# Patient Record
Sex: Female | Born: 1960 | Race: White | Hispanic: No | Smoking: Never smoker
Health system: Southern US, Community
[De-identification: ages and names within clinical notes are randomized; demographics above are authoritative.]

---

## 2004-07-26 ENCOUNTER — Emergency Department (HOSPITAL_COMMUNITY): Admission: EM | Admit: 2004-07-26 | Discharge: 2004-07-26 | Payer: Self-pay

## 2004-11-16 ENCOUNTER — Emergency Department (HOSPITAL_COMMUNITY): Admission: EM | Admit: 2004-11-16 | Discharge: 2004-11-16 | Payer: Self-pay | Admitting: Family Medicine

## 2007-01-13 ENCOUNTER — Other Ambulatory Visit: Admission: RE | Admit: 2007-01-13 | Discharge: 2007-01-13 | Payer: Self-pay | Admitting: Gynecology

## 2007-04-12 ENCOUNTER — Ambulatory Visit (HOSPITAL_BASED_OUTPATIENT_CLINIC_OR_DEPARTMENT_OTHER): Admission: RE | Admit: 2007-04-12 | Discharge: 2007-04-12 | Payer: Self-pay | Admitting: Gynecology

## 2007-04-12 ENCOUNTER — Encounter (INDEPENDENT_AMBULATORY_CARE_PROVIDER_SITE_OTHER): Payer: Self-pay | Admitting: Gynecology

## 2008-03-09 ENCOUNTER — Emergency Department (HOSPITAL_COMMUNITY): Admission: EM | Admit: 2008-03-09 | Discharge: 2008-03-09 | Payer: Self-pay | Admitting: Emergency Medicine

## 2010-12-22 NOTE — Op Note (Signed)
Alexis Cohen, Alexis Cohen NO.:  [id number]   MEDICAL RECORD NO.:  [id number]        PATIENT TYPE:  HAMB   LOCATION:                               FACILITY:  [facility]   PHYSICIAN:  Gretta Cool, M.D. 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   PROCEDURE:  Wide excision of carcinoma in situ of the vulva.   SURGEON:  Gretta Cool, M.D.   ANESTHESIA:  IV sedation and Marcaine 0.5% infiltration local  anesthesia.   DESCRIPTION OF PROCEDURE:  Under excellent anesthesia as above, the  surgical site was identified and infiltrated with Marcaine 0.5%.  The  area to be excised was outlined with a marker pen and then the entire  area excised with at least 5 mm margin around the entire extent of the  lesion.  The incision was then closed with deep suture of 4-0 Vicryl and  skin suture of 4-0 braided silk.  A mattress closure was used to close  the skin edge.  The skin edge was then dried and Xylocaine ointment  applied to the skin surface so as to seal the incision and the suture  sites.  At this point, the procedure was terminated without  complications.  Patient returned to the recovery room in excellent  condition.           ______________________________  Gretta Cool, M.D.     CWL/MEDQ  D:  04/12/2007  T:  04/12/2007  Job:  [id number]   cc:   Clement Husbands, M.D.  Fax: [phone]

## 2011-05-07 LAB — BASIC METABOLIC PANEL
BUN: 6
Chloride: 107
Creatinine, Ser: 0.61
Glucose, Bld: 116 — ABNORMAL HIGH
Potassium: 3.5

## 2011-05-07 LAB — CBC
HCT: 35 — ABNORMAL LOW
MCV: 80.1
Platelets: 280
RDW: 13.7

## 2011-05-07 LAB — DIFFERENTIAL
Basophils Absolute: 0
Basophils Relative: 1
Eosinophils Absolute: 0.3
Eosinophils Relative: 3
Neutrophils Relative %: 72

## 2011-05-21 LAB — POCT PREGNANCY, URINE: Preg Test, Ur: NEGATIVE

## 2011-05-21 LAB — POCT I-STAT 4, (NA,K, GLUC, HGB,HCT)
Glucose, Bld: 102 — ABNORMAL HIGH
HCT: 41
Hemoglobin: 13.9
Operator id: 268271

## 2020-02-01 ENCOUNTER — Encounter (HOSPITAL_COMMUNITY): Payer: Self-pay

## 2020-02-01 ENCOUNTER — Emergency Department (HOSPITAL_COMMUNITY)
Admission: EM | Admit: 2020-02-01 | Discharge: 2020-02-02 | Disposition: A | Discharge status: Home / Self Care | Payer: Self-pay | Attending: Emergency Medicine | Admitting: Emergency Medicine

## 2020-02-01 ENCOUNTER — Other Ambulatory Visit: Payer: Self-pay

## 2020-02-01 DIAGNOSIS — S0101XA Laceration without foreign body of scalp, initial encounter: Secondary | ICD-10-CM | POA: Insufficient documentation

## 2020-02-01 DIAGNOSIS — W108XXA Fall (on) (from) other stairs and steps, initial encounter: Secondary | ICD-10-CM | POA: Insufficient documentation

## 2020-02-01 DIAGNOSIS — Y999 Unspecified external cause status: Secondary | ICD-10-CM | POA: Insufficient documentation

## 2020-02-01 DIAGNOSIS — Y92009 Unspecified place in unspecified non-institutional (private) residence as the place of occurrence of the external cause: Secondary | ICD-10-CM | POA: Insufficient documentation

## 2020-02-01 DIAGNOSIS — S0990XA Unspecified injury of head, initial encounter: Secondary | ICD-10-CM

## 2020-02-01 DIAGNOSIS — Z23 Encounter for immunization: Secondary | ICD-10-CM | POA: Insufficient documentation

## 2020-02-01 DIAGNOSIS — Y9301 Activity, walking, marching and hiking: Secondary | ICD-10-CM | POA: Insufficient documentation

## 2020-02-01 DIAGNOSIS — W19XXXA Unspecified fall, initial encounter: Secondary | ICD-10-CM

## 2020-02-01 NOTE — ED Triage Notes (Signed)
Fall down 6 steps outside. Hit head on concrete. Denies LOC. Denies blood thinners. Right hand pain.

## 2020-02-02 ENCOUNTER — Emergency Department (HOSPITAL_COMMUNITY): Payer: Self-pay

## 2020-02-02 MED ORDER — TETANUS-DIPHTH-ACELL PERTUSSIS 5-2.5-18.5 LF-MCG/0.5 IM SUSP
0.5000 mL | Freq: Once | INTRAMUSCULAR | Status: AC
  Administered 2020-02-02: 0.5 mL via INTRAMUSCULAR
  Filled 2020-02-02: qty 0.5

## 2020-02-02 MED ORDER — ACETAMINOPHEN 500 MG PO TABS
1000.0000 mg | ORAL_TABLET | Freq: Once | ORAL | Status: AC
  Administered 2020-02-02: 1000 mg via ORAL
  Filled 2020-02-02: qty 2

## 2020-02-02 NOTE — ED Notes (Signed)
PT declined vitals recheck when offered.

## 2020-02-02 NOTE — ED Provider Notes (Signed)
[facility]-EMERGENCY DEPT Provider Note   CSN: [id number] Arrival date & time: 02/01/20  2324     History Chief Complaint  Patient presents with  . Fall    Alexis Cohen is a 59 y.o. female with a history of hypertension who presents to the emergency department via EMS status post mechanical fall shortly prior to arrival.  Patient states that she was walking down concrete steps in the dark at a friend's house when she missed the first step, fell backwards, and slid down stairs.  Approximates she fell down 6 stairs.  She tried to hold onto the railing with her left hand resulting in brush burn to this area.  She states that she did hit the back of her head resulting in a wound which bled.  He is having pain primarily to the back of her head into the palm of her right hand as well as mildly to the left wrist and hip.  Worse with palpation, no alleviating factors.  No intervention prior to arrival.  Feel a bit woozy after the injury but this has resolved at this time.  She denies loss of consciousness, visual disturbance, vomiting, numbness, weakness, chest pain, abdominal pain, or back pain.  Unknown last tetanus.  She denies anticoagulation use.  No prodromal symptoms prior to fall, states that she just missed the step.  HPI     History reviewed. No pertinent past medical history.  There are no problems to display for this patient.   History reviewed. No pertinent surgical history.   OB History   No obstetric history on file.     No family history on file.  Social History   Tobacco Use  . Smoking status: Never Smoker  . Smokeless tobacco: Never Used  Substance Use Topics  . Alcohol use: Not Currently  . Drug use: Not Currently    Home Medications Prior to Admission medications   Not on File    Allergies    Penicillins  Review of Systems   Review of Systems  Constitutional: Negative for chills and fever.  Eyes: Negative for visual disturbance.    Respiratory: Negative for shortness of breath.   Cardiovascular: Negative for chest pain.  Gastrointestinal: Negative for abdominal pain and vomiting.  Musculoskeletal: Positive for arthralgias.  Skin: Positive for wound.  Neurological: Positive for dizziness (resolved @ present) and headaches. Negative for seizures, syncope, weakness and numbness.  All other systems reviewed and are negative.   Physical Exam Updated Vital Signs BP (!) 164/82   Pulse 90   Temp 98.1 F (36.7 C)   Resp 16   SpO2 99%   Physical Exam Vitals and nursing note reviewed.  Constitutional:      General: She is not in acute distress.    Appearance: She is well-developed.  HENT:     Head: No raccoon eyes or Battle's sign.      Right Ear: No hemotympanum.     Left Ear: No hemotympanum.  Eyes:     General:        Right eye: No discharge.        Left eye: No discharge.     Conjunctiva/sclera: Conjunctivae normal.     Pupils: Pupils are equal, round, and reactive to light.  Cardiovascular:     Rate and Rhythm: Normal rate and regular rhythm.     Heart sounds: No murmur heard.      Comments: 2+ symmetric radial and DP pulses bilaterally. Pulmonary:  Effort: No respiratory distress.     Breath sounds: Normal breath sounds. No wheezing or rales.  Chest:     Chest wall: No tenderness.  Abdominal:     General: There is no distension.     Palpations: Abdomen is soft.     Tenderness: There is no abdominal tenderness. There is no guarding or rebound.  Musculoskeletal:     Cervical back: Normal range of motion and neck supple. No spinous process tenderness.     Comments: Upper extremities: Patient has abrasions to the anterior aspect of the right hand, no significant open wounds or active bleeding.  She has intact active range of motion throughout bilateral upper extremities.  She is tender throughout the palm of the right hand as well as to the ulnar aspect of the left wrist.  Upper extremities are  otherwise nontender.  No anatomical snuffbox tenderness Back: No midline tenderness or palpable step-off Lower extremities: Small abrasion to the left fifth lateral digit and to the left lateral malleolus.  No significant open wounds or active bleeding.  Patient has intact active range of motion throughout.  She is mildly tender to the left lateral hip.  Otherwise nontender.  Skin:    General: Skin is warm and dry.     Findings: No rash.  Neurological:     Comments: Alert.  Clear speech.  CN III through XII grossly intact.  Sensation grossly intact bilateral upper and lower extremities.  Patient has 5 out of 5 symmetric grip strength and strength plantar dorsiflexion bilaterally.  She is ambulatory.  Psychiatric:        Behavior: Behavior normal.     ED Results / Procedures / Treatments   Labs (all labs ordered are listed, but only abnormal results are displayed) Labs Reviewed - No data to display  EKG None  Radiology DG Wrist Complete Left  Result Date: 02/02/2020 CLINICAL DATA:  Fall EXAM: LEFT WRIST - COMPLETE 3+ VIEW COMPARISON:  None. FINDINGS: No acute bony abnormality. Specifically, no fracture, subluxation, or dislocation. Mild arthrosis at the first carpometacarpal and triscaphe joints as well as milder changes at the radiocarpal articulation. Normal bone mineralization. No worrisome osseous lesions. Soft tissues are unremarkable. IMPRESSION: No acute osseous abnormality. Degenerative changes as above. Electronically Signed   By: Kreg Shropshire M.D.   On: 02/02/2020 00:41   CT Head Wo Contrast  Result Date: 02/02/2020 CLINICAL DATA:  Fall with head strike EXAM: CT HEAD WITHOUT CONTRAST TECHNIQUE: Contiguous axial images were obtained from the base of the skull through the vertex without intravenous contrast. COMPARISON:  None. FINDINGS: Brain: No evidence of acute infarction, hemorrhage, hydrocephalus, extra-axial collection or mass lesion/mass effect. Vascular: Atherosclerotic  calcification of the carotid siphons. No hyperdense vessel. Skull: Mild thickening and laceration of the high right parietal and midline scalp with punctate radiodensities possibly reflecting some debris. No subjacent calvarial fracture or acute osseous injury. No worrisome osseous lesions. Sinuses/Orbits: Paranasal sinuses and mastoid air cells are predominantly clear. Included orbital structures are unremarkable. Other: None IMPRESSION: 1. No acute intracranial abnormality. 2. Mild thickening and laceration of the high right parietal and midline scalp with punctate radiodensities possibly reflecting some debris, correlate with visual inspection. 3. No subjacent calvarial fracture or acute osseous injury. Electronically Signed   By: Kreg Shropshire M.D.   On: 02/02/2020 00:45   DG Hand Complete Right  Result Date: 02/02/2020 CLINICAL DATA:  Fall down 6 steps with head strike EXAM: RIGHT HAND - COMPLETE 3+ VIEW  COMPARISON:  None FINDINGS: No acute bony abnormality. Specifically, no fracture, subluxation, or dislocation. Mild arthrosis at the radiocarpal, first carpometacarpal and triscaphe joints as well as throughout the interphalangeal joints. Normal mineralization. No worrisome osseous lesions. Soft tissues are unremarkable. IMPRESSION: No acute osseous abnormality.  Degenerative changes as above. Electronically Signed   By: Lovena Le M.D.   On: 02/02/2020 00:39   DG Hip Unilat With Pelvis 2-3 Views Left  Result Date: 02/02/2020 CLINICAL DATA:  Fall EXAM: DG HIP (WITH OR WITHOUT PELVIS) 2-3V LEFT COMPARISON:  None FINDINGS: The bones of the pelvis appear intact and congruent. The proximal femora are intact and normally located within the acetabula. Minimal degenerative changes present in the lower lumbar spine, SI joints and symphysis pubis. Additional mild degenerative features of the bilateral hips, right slightly greater than left. Enthesopathy noted about the iliac crests and ischial tuberosities.  Normal bone mineralization. No worrisome osseous lesions. Vascular calcium in the pelvis with few calcified phleboliths as well. No acute soft tissue abnormality. IMPRESSION: 1. No acute osseous abnormality. 2. Mild degenerative changes in the spine, pelvis and hips. Electronically Signed   By: Lovena Le M.D.   On: 02/02/2020 00:42    Procedures .Marland KitchenLaceration Repair  Date/Time: 02/02/2020 1:46 AM Performed by: Amaryllis Dyke, PA-C Authorized by: Amaryllis Dyke, PA-C   Consent:    Consent obtained:  Verbal   Consent given by:  Patient   Risks discussed:  Infection, need for additional repair, nerve damage, poor wound healing, poor cosmetic result, pain, retained foreign body, tendon damage and vascular damage   Alternatives discussed:  No treatment Anesthesia (see MAR for exact dosages):    Anesthesia method:  None Laceration details:    Location:  Scalp   Scalp location:  R parietal   Length (cm):  0.5   Depth (mm):  3 Repair type:    Repair type:  Simple Exploration:    Hemostasis achieved with:  Direct pressure   Wound exploration: wound explored through full range of motion and entire depth of wound probed and visualized     Contaminated: Initially some debris.   Treatment:    Area cleansed with:  Betadine   Irrigation solution:  Sterile water   Irrigation volume:  1L   Irrigation method:  Pressure wash   Visualized foreign bodies/material removed: yes   Skin repair:    Repair method:  Tissue adhesive Post-procedure details:    Patient tolerance of procedure:  Tolerated well, no immediate complications   (including critical care time)  Medications Ordered in ED Medications  Tdap (BOOSTRIX) injection 0.5 mL (0.5 mLs Intramuscular Given 02/02/20 0032)  acetaminophen (TYLENOL) tablet 1,000 mg (1,000 mg Oral Given 02/02/20 0032)    ED Course  I have reviewed the triage vital signs and the nursing notes.  Pertinent labs & imaging results that were  available during my care of the patient were reviewed by me and considered in my medical decision making (see chart for details).    MDM Rules/Calculators/A&P                         Patient presents to the emergency department status post mechanical fall with head injury.  Patient is nontoxic, resting comfortably, vitals with elevated blood pressure, low suspicion for hypertensive emergency.  Additional history obtained from nursing note and chart review I have ordered imaging including CT head and x-rays of the left wrist, right hand, and left  hip.  I personally reviewed and interpreted imaging.  No signs of intracranial bleeding, fracture, or dislocation.  Findings of laceration on CT imaging as well as some degenerative changes with musculoskeletal x-rays.   No signs of serious head, neck, or back injury, CT head negative, no midline spinal tenderness or focal neurologic deficits.  No chest/abdominal tenderness to raise concern for intrathoracic/abdominal injury.  Her musculoskeletal x-rays are negative for fracture or dislocation, she is neurovascularly intact distally from these areas of discomfort.  All wounds were cleansed in the emergency department.  Her scalp laceration specifically was cleansed with Betadine, pressure irrigated, and visualized in a bloodless field without any remaining visible foreign bodies.  This was repaired utilizing Dermabond with hair apposition technique.  Tolerated well.  Her tetanus is to be updated in the ER.  She is feeling better and would like to go home.  Will discharge with naproxen to help with any discomfort, chart reviewed- last creatinine WNL.  Primary care follow-up. I discussed results, treatment plan, need for follow-up, and return precautions with the patient. Provided opportunity for questions, patient confirmed understanding and is in agreement with plan.   Final Clinical Impression(s) / ED Diagnoses Final diagnoses:  Fall, initial encounter    Injury of head, initial encounter  Laceration of scalp, initial encounter    Rx / DC Orders ED Discharge Orders    None       Cherly Anderson, PA-C 02/02/20 0151    Nira Conn, MD 02/02/20 [phone]

## 2020-02-02 NOTE — ED Notes (Signed)
Pt ambulated to wash her hands

## 2020-02-02 NOTE — Discharge Instructions (Addendum)
You were seen in the emergency department today following a fall.  The CT of your head did not show any internal bleeding and your x-rays did not show any fractures.  Your CT scan did show a laceration with some possible debris in it, this was irrigated and closed with skin glue utilizing your hair.  We are sending you home with naproxen to help with pain and swelling.  - Naproxen is a nonsteroidal anti-inflammatory medication that will help with pain and swelling. Be sure to take this medication as prescribed with food, 1 pill every 12 hours,  It should be taken with food, as it can cause stomach upset, and more seriously, stomach bleeding. Do not take other nonsteroidal anti-inflammatory medications with this such as Advil, Motrin, Aleve, Mobic, Goodie Powder, or Motrin.    You make take Tylenol per over the counter dosing with these medications.   We have prescribed you new medication(s) today. Discuss the medications prescribed today with your pharmacist as they can have adverse effects and interactions with your other medicines including over the counter and prescribed medications. Seek medical evaluation if you start to experience new or abnormal symptoms after taking one of these medicines, seek care immediately if you start to experience difficulty breathing, feeling of your throat closing, facial swelling, or rash as these could be indications of a more serious allergic reaction  Please be sure to rest and avoid screen time or activities that require a great deal of focus. Please follow with your primary care provider within 1 week for reevaluation of your symptoms. Return to the emergency department for new or worsening symptoms including but not limited to worsening headache, inability to keep fluids down, change in your vision, numbness, weakness, neck pain, back pain, chest pain, abdominal pain, redness around your wound, pus draining from the wound, fever, neck stiffness, or any other  concerns that you may have.

## 2021-11-03 IMAGING — CR DG WRIST COMPLETE 3+V*L*
4 series · 4 of 4 positions shown · non-contrast
Comparison: None.

CLINICAL DATA: Fall

EXAM:
LEFT WRIST - COMPLETE 3+ VIEW

[x wrist lat left]
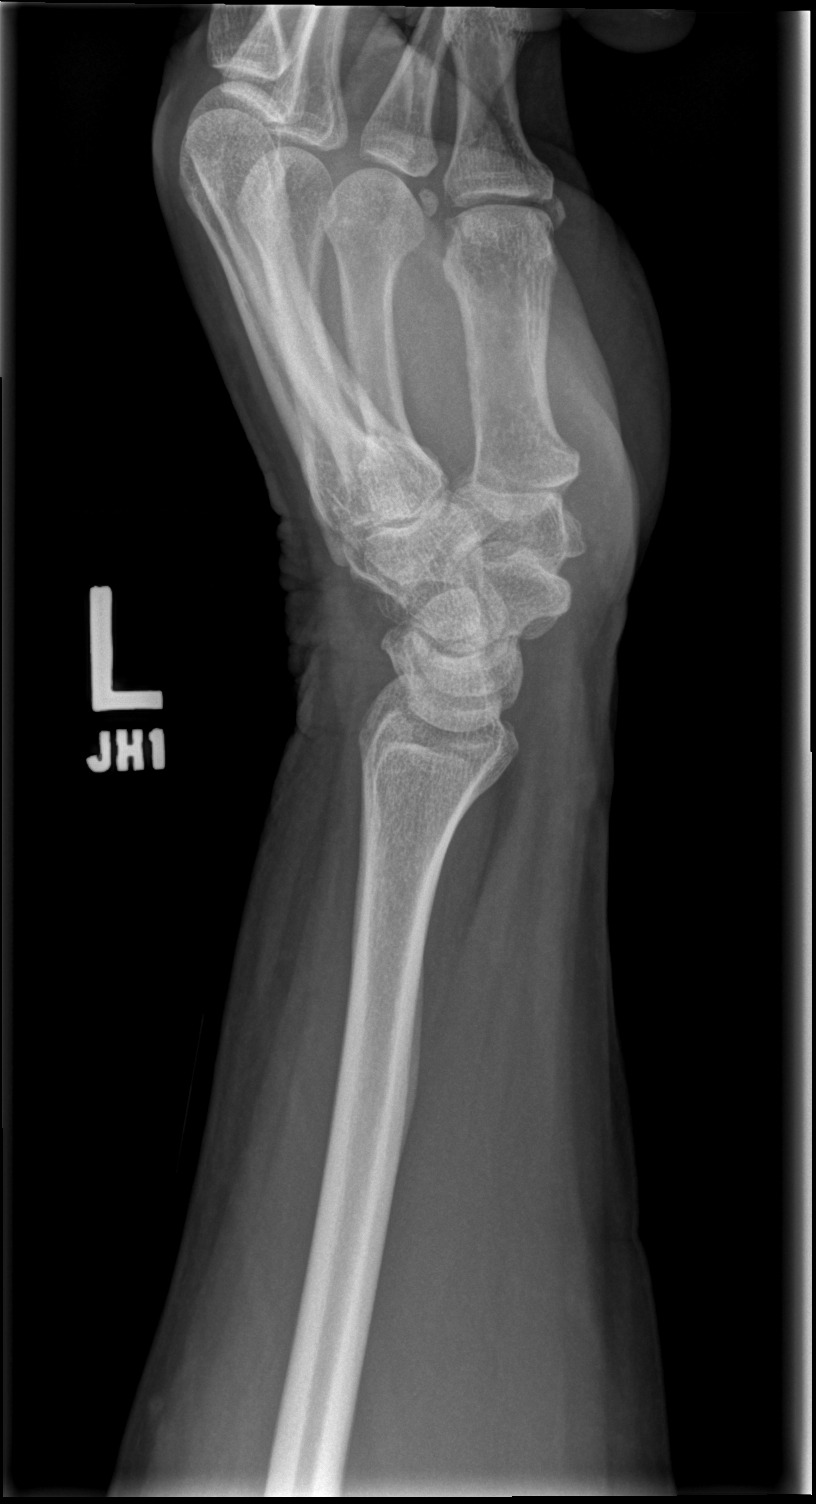

[x wrist pa left]
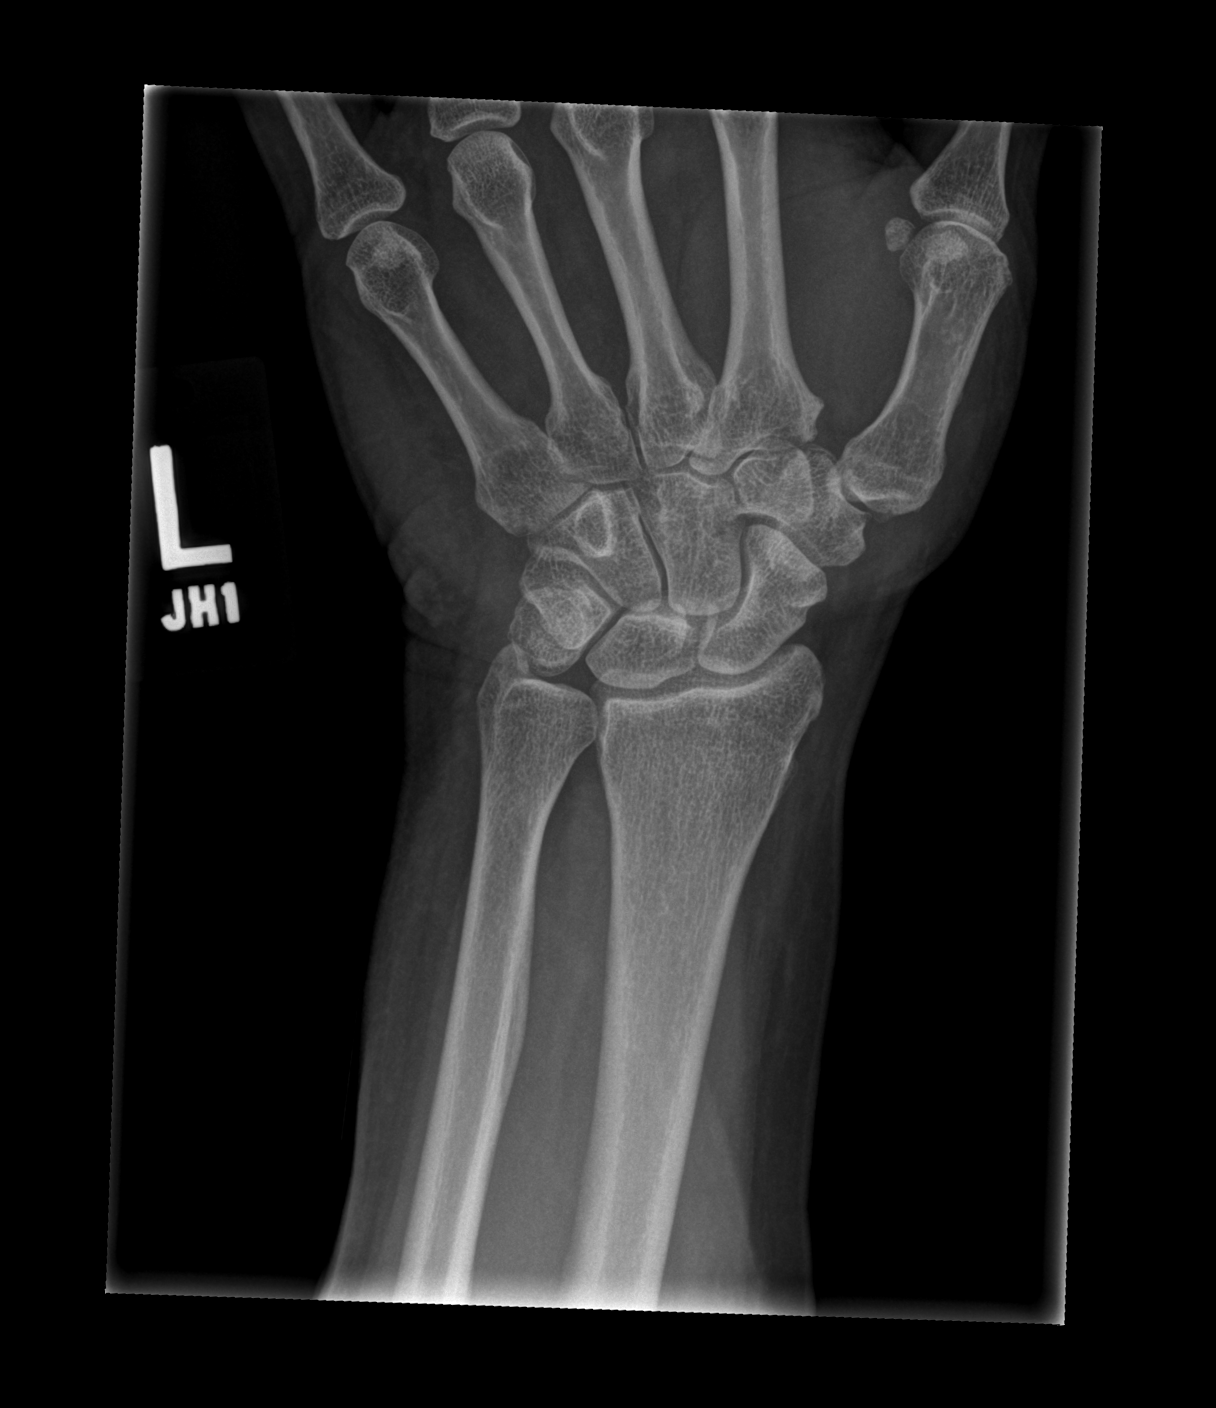

[x wrist obl left]
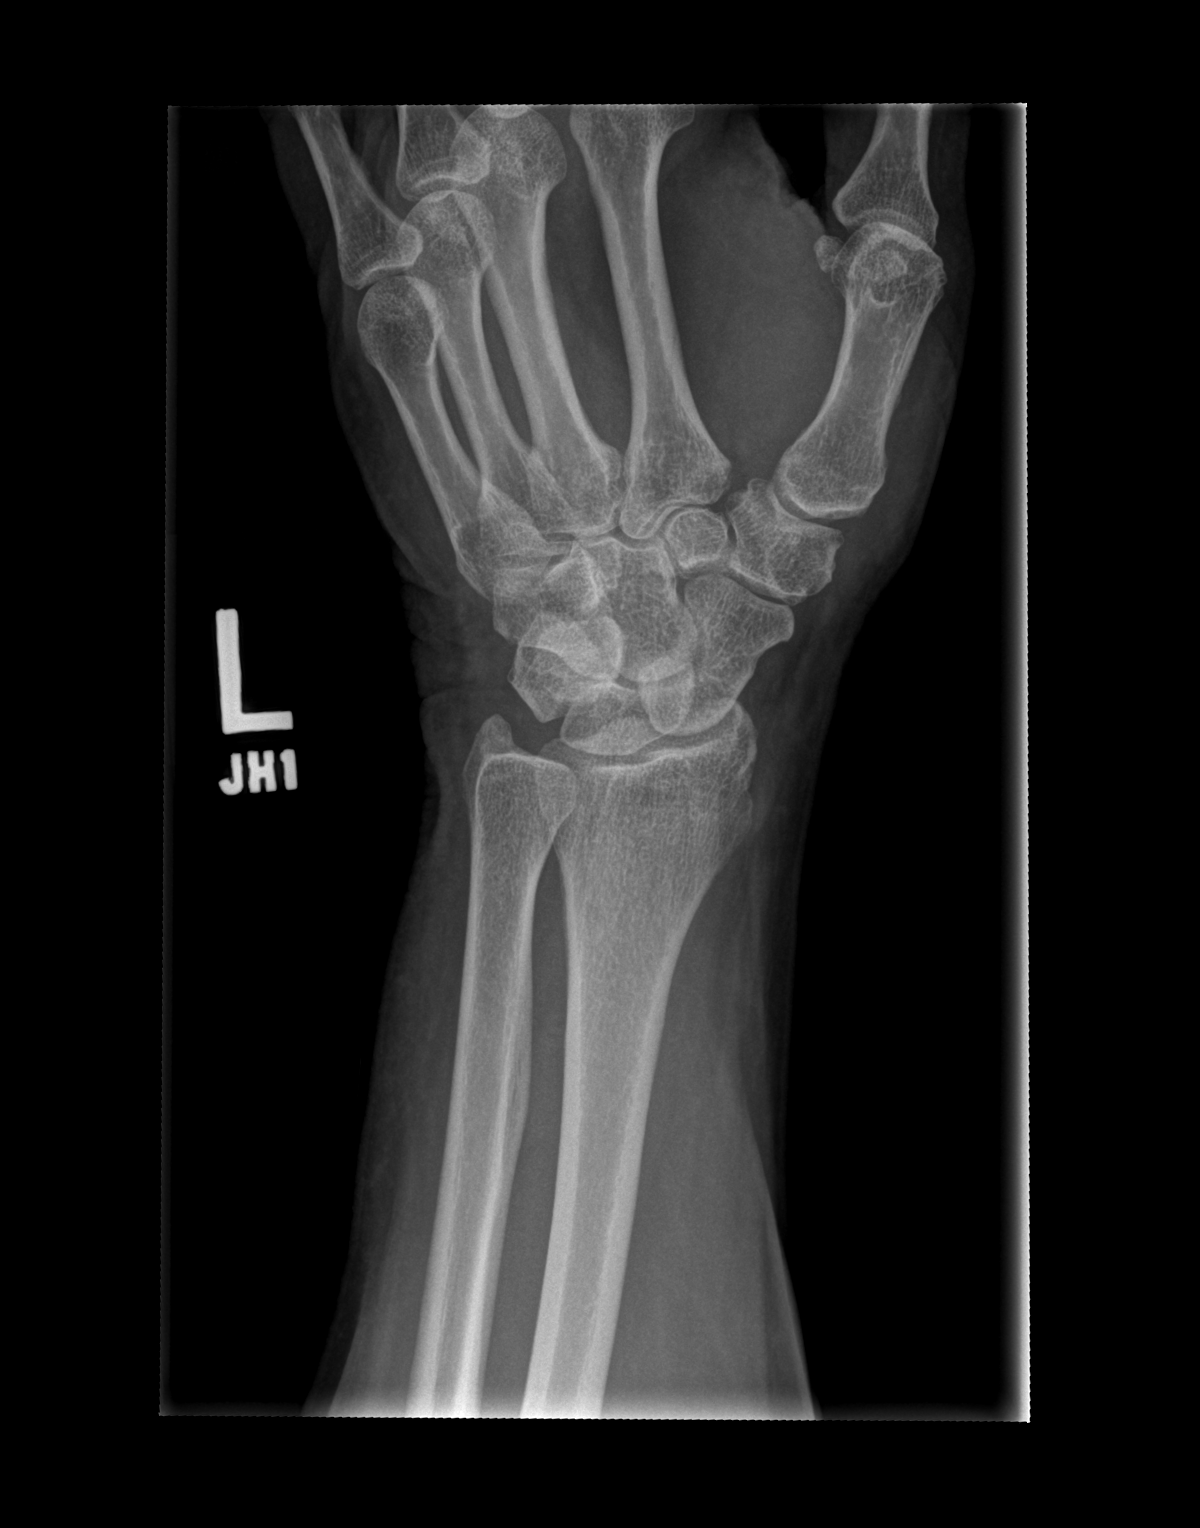

[x wrist navicular view left]
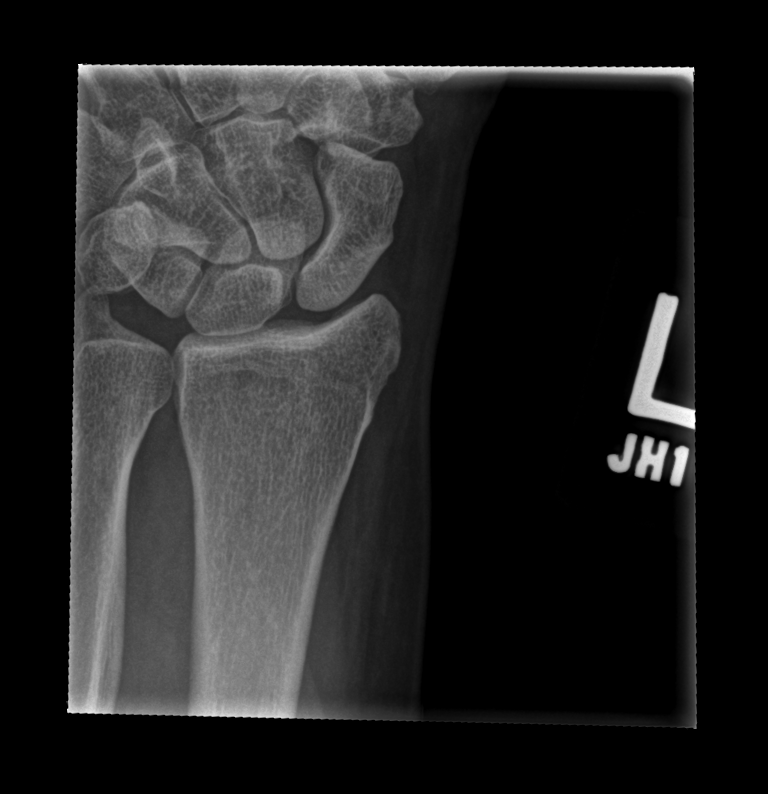

[4 of 4 positions shown; findings below may reference images not displayed]

FINDINGS: No acute bony abnormality. Specifically, no fracture, subluxation,
or dislocation. Mild arthrosis at the first carpometacarpal and
triscaphe joints as well as milder changes at the radiocarpal
articulation. Normal bone mineralization. No worrisome osseous
lesions. Soft tissues are unremarkable.
IMPRESSION: No acute osseous abnormality.

Degenerative changes as above.
